# Patient Record
Sex: Female | Born: 1980 | Race: White | Hispanic: No | Marital: Married | Smoking: Current every day smoker
Health system: Southern US, Community
[De-identification: ages and names within clinical notes are randomized; demographics above are authoritative.]

## PROBLEM LIST (undated history)

## (undated) DIAGNOSIS — L209 Atopic dermatitis, unspecified: Secondary | ICD-10-CM

## (undated) DIAGNOSIS — N200 Calculus of kidney: Secondary | ICD-10-CM

## (undated) DIAGNOSIS — R011 Cardiac murmur, unspecified: Secondary | ICD-10-CM

## (undated) HISTORY — PX: CYSTOSCOPY WITH STENT PLACEMENT: SHX5790

## (undated) HISTORY — DX: Calculus of kidney: N20.0

## (undated) HISTORY — DX: Atopic dermatitis, unspecified: L20.9

## (undated) HISTORY — PX: LITHOTRIPSY: SUR834

---

## 2011-02-13 ENCOUNTER — Encounter: Payer: Self-pay | Admitting: Nurse Practitioner

## 2011-02-13 DIAGNOSIS — L209 Atopic dermatitis, unspecified: Secondary | ICD-10-CM | POA: Insufficient documentation

## 2013-03-28 ENCOUNTER — Ambulatory Visit (INDEPENDENT_AMBULATORY_CARE_PROVIDER_SITE_OTHER): Payer: BC Managed Care – PPO | Admitting: General Practice

## 2013-03-28 ENCOUNTER — Encounter: Payer: Self-pay | Admitting: General Practice

## 2013-03-28 VITALS — BP 138/86 | HR 90 | Temp 98.5°F | Ht 59.0 in | Wt 144.0 lb

## 2013-03-28 DIAGNOSIS — Z0000 Encounter for general adult medical examination without abnormal findings: Secondary | ICD-10-CM

## 2013-03-28 DIAGNOSIS — Z124 Encounter for screening for malignant neoplasm of cervix: Secondary | ICD-10-CM

## 2013-03-28 DIAGNOSIS — Z309 Encounter for contraceptive management, unspecified: Secondary | ICD-10-CM

## 2013-03-28 DIAGNOSIS — R51 Headache: Secondary | ICD-10-CM

## 2013-03-28 LAB — POCT UA - MICROSCOPIC ONLY: Crystals, Ur, HPF, POC: NEGATIVE

## 2013-03-28 LAB — POCT URINALYSIS DIPSTICK
Bilirubin, UA: NEGATIVE
Glucose, UA: NEGATIVE
Spec Grav, UA: 1.015

## 2013-03-28 MED ORDER — NORGESTIM-ETH ESTRAD TRIPHASIC 0.18/0.215/0.25 MG-25 MCG PO TABS: 1.0000 | ORAL_TABLET | ORAL | Status: DC

## 2013-03-28 MED ORDER — BUTALBITAL-ASA-CAFFEINE 50-325-40 MG PO CAPS: 1.0000 | ORAL_CAPSULE | Freq: Two times a day (BID) | ORAL | Status: AC | PRN

## 2013-03-28 NOTE — Progress Notes (Signed)
  Subjective:    Patient ID: Elizabeth Krause, female    DOB: Aug 07, 1981, 32 y.o.   MRN: [id number]  Gynecologic Exam The patient's pertinent negatives include no genital itching, genital lesions, genital odor, missed menses, pelvic pain, vaginal bleeding or vaginal discharge. Associated symptoms include headaches. Pertinent negatives include no abdominal pain, chills, diarrhea, dysuria, fever, flank pain, nausea, painful intercourse or rash. She uses oral contraceptives for contraception. Her menstrual history has been regular. There is no history of a Cesarean section, a gynecological surgery, miscarriage or an STD.  Reports throbbing headache that occurs about every other day. Reports decreased use of caffeine (3-4 cups weekly), and denies chocolate. OTC goody powders 2-3 packets (10 packs weekly). Aleve, tylenol, and other OTC medications not effective. Reports family history of migraines.    Review of Systems  Constitutional: Negative for fever and chills.  Respiratory: Negative for chest tightness.   Cardiovascular: Negative for chest pain and palpitations.  Gastrointestinal: Negative for nausea, abdominal pain, diarrhea and abdominal distention.  Genitourinary: Negative for dysuria, flank pain, vaginal discharge, pelvic pain and missed menses.  Skin: Negative.  Negative for rash.  Neurological: Positive for headaches. Negative for dizziness.  Psychiatric/Behavioral: Negative.        Objective:   Physical Exam  Constitutional: She is oriented to person, place, and time. She appears well-developed and well-nourished.  Cardiovascular: Normal rate, regular rhythm, normal heart sounds and intact distal pulses.   No murmur heard. Pulmonary/Chest: Effort normal and breath sounds normal. No respiratory distress. She exhibits no tenderness.  Abdominal: Soft. Bowel sounds are normal. She exhibits no distension. There is no tenderness.  Genitourinary: Vagina normal and uterus normal. No  breast swelling, tenderness, discharge or bleeding. Pelvic exam was performed with patient prone. There is no rash, tenderness, lesion or injury on the right labia. There is no rash, tenderness, lesion or injury on the left labia. Cervix exhibits no motion tenderness and no discharge. Right adnexum displays no mass and no tenderness. Left adnexum displays no mass and no tenderness.  Neurological: She is alert and oriented to person, place, and time.  Skin: Skin is warm and dry.          Assessment & Plan:  Continue taking current medications Take prescribed medications as order Discussed self breast exams Discussed healthy diet and exercise Discussed stress reduction and relaxation techniques Patient verbalized understanding Raymon Mutton, FNP-C

## 2013-03-28 NOTE — Patient Instructions (Addendum)

## 2013-03-29 LAB — PAP IG, CT-NG, RFX HPV ASCU
Chlamydia Probe Amp: NEGATIVE
GC Probe Amp: NEGATIVE

## 2013-04-03 ENCOUNTER — Telehealth: Payer: Self-pay | Admitting: General Practice

## 2013-04-03 NOTE — Telephone Encounter (Signed)
Mae to address 

## 2013-04-04 ENCOUNTER — Other Ambulatory Visit: Payer: Self-pay | Admitting: General Practice

## 2013-04-04 DIAGNOSIS — IMO0001 Reserved for inherently not codable concepts without codable children: Secondary | ICD-10-CM

## 2013-04-04 MED ORDER — NORGESTIM-ETH ESTRAD TRIPHASIC 0.18/0.215/0.25 MG-35 MCG PO TABS: 1.0000 | ORAL_TABLET | Freq: Every day | ORAL | Status: AC

## 2013-04-05 NOTE — Telephone Encounter (Signed)
Tri-sprintec prescription sent to pharmacy. thx

## 2013-04-06 NOTE — Telephone Encounter (Signed)
Pt aware [facility] is [facility]

## 2013-04-28 ENCOUNTER — Telehealth: Payer: Self-pay | Admitting: General Practice

## 2013-05-01 NOTE — Telephone Encounter (Signed)
CALLED PT AND SHE SAID SHE WAS OKAY WITH HER MEDICINE AND DIDN'T NEED A REFILL.

## 2013-06-08 ENCOUNTER — Encounter: Payer: Self-pay | Admitting: Nurse Practitioner

## 2013-06-08 ENCOUNTER — Ambulatory Visit (INDEPENDENT_AMBULATORY_CARE_PROVIDER_SITE_OTHER): Payer: BC Managed Care – PPO

## 2013-06-08 ENCOUNTER — Ambulatory Visit (INDEPENDENT_AMBULATORY_CARE_PROVIDER_SITE_OTHER): Payer: BC Managed Care – PPO | Admitting: Nurse Practitioner

## 2013-06-08 ENCOUNTER — Telehealth: Payer: Self-pay | Admitting: General Practice

## 2013-06-08 ENCOUNTER — Telehealth: Payer: Self-pay | Admitting: Nurse Practitioner

## 2013-06-08 VITALS — BP 155/102 | HR 85 | Temp 98.5°F | Ht 59.0 in | Wt 145.0 lb

## 2013-06-08 DIAGNOSIS — N390 Urinary tract infection, site not specified: Secondary | ICD-10-CM

## 2013-06-08 DIAGNOSIS — R319 Hematuria, unspecified: Secondary | ICD-10-CM

## 2013-06-08 LAB — POCT UA - MICROSCOPIC ONLY: Bacteria, U Microscopic: NEGATIVE

## 2013-06-08 LAB — POCT URINALYSIS DIPSTICK
Glucose, UA: NEGATIVE
Ketones, UA: NEGATIVE
Spec Grav, UA: <=1.005
Urobilinogen, UA: NEGATIVE

## 2013-06-08 MED ORDER — CIPROFLOXACIN HCL 500 MG PO TABS: 500.0000 mg | ORAL_TABLET | Freq: Two times a day (BID) | ORAL | Status: AC

## 2013-06-08 MED ORDER — HYDROCODONE-ACETAMINOPHEN 5-325 MG PO TABS: 1.0000 | ORAL_TABLET | Freq: Four times a day (QID) | ORAL | Status: AC | PRN

## 2013-06-08 NOTE — Patient Instructions (Addendum)
Urinary Tract Infection  Urinary tract infections (UTIs) can develop anywhere along your urinary tract. Your urinary tract is your body's drainage system for removing wastes and extra water. Your urinary tract includes two kidneys, two ureters, a bladder, and a urethra. Your kidneys are a pair of bean-shaped organs. Each kidney is about the size of your fist. They are located below your ribs, one on each side of your spine.  CAUSES  Infections are caused by microbes, which are microscopic organisms, including fungi, viruses, and bacteria. These organisms are so small that they can only be seen through a microscope. Bacteria are the microbes that most commonly cause UTIs.  SYMPTOMS   Symptoms of UTIs may vary by age and gender of the patient and by the location of the infection. Symptoms in young women typically include a frequent and intense urge to urinate and a painful, burning feeling in the bladder or urethra during urination. Older women and men are more likely to be tired, shaky, and weak and have muscle aches and abdominal pain. A fever may mean the infection is in your kidneys. Other symptoms of a kidney infection include pain in your back or sides below the ribs, nausea, and vomiting.  DIAGNOSIS  To diagnose a UTI, your caregiver will ask you about your symptoms. Your caregiver also will ask to provide a urine sample. The urine sample will be tested for bacteria and white blood cells. White blood cells are made by your body to help fight infection.  TREATMENT   Typically, UTIs can be treated with medication. Because most UTIs are caused by a bacterial infection, they usually can be treated with the use of antibiotics. The choice of antibiotic and length of treatment depend on your symptoms and the type of bacteria causing your infection.  HOME CARE INSTRUCTIONS   If you were prescribed antibiotics, take them exactly as your caregiver instructs you. Finish the medication even if you feel better after you  have only taken some of the medication.   Drink enough water and fluids to keep your urine clear or pale yellow.   Avoid caffeine, tea, and carbonated beverages. They tend to irritate your bladder.   Empty your bladder often. Avoid holding urine for long periods of time.   Empty your bladder before and after sexual intercourse.   After a bowel movement, women should cleanse from front to back. Use each tissue only once.  SEEK MEDICAL CARE IF:    You have back pain.   You develop a fever.   Your symptoms do not begin to resolve within 3 days.  SEEK IMMEDIATE MEDICAL CARE IF:    You have severe back pain or lower abdominal pain.   You develop chills.   You have nausea or vomiting.   You have continued burning or discomfort with urination.  MAKE SURE YOU:    Understand these instructions.   Will watch your condition.   Will get help right away if you are not doing well or get worse.  Document Released: 08/19/2005 Document Revised: 05/10/2012 Document Reviewed: 12/18/2011  Exit[employer] Patient Information 2014 [employer].

## 2013-06-08 NOTE — Progress Notes (Signed)
Subjective:    Patient ID: Elizabeth Krause, female    DOB: July 11, 1981, 32 y.o.   MRN: [id number]  Abdominal Pain This is a new problem. The current episode started yesterday. The onset quality is gradual. The problem occurs 2 to 4 times per day. The problem has been unchanged. The pain is located in the RLQ, LUQ and suprapubic region. The pain is at a severity of 7/10. The pain is moderate. The quality of the pain is aching and sharp. Associated symptoms include frequency. Associated symptoms comments: urgency . The pain is aggravated by coughing, certain positions and movement. The pain is relieved by being still. She has tried nothing for the symptoms. There is no history of GERD.      Review of Systems  Gastrointestinal: Positive for abdominal pain.  Genitourinary: Positive for urgency, frequency and flank pain.  All other systems reviewed and are negative.       Objective:   Physical Exam  Constitutional: She is oriented to person, place, and time. She appears well-developed and well-nourished.  Cardiovascular: Normal rate, regular rhythm, normal heart sounds and intact distal pulses.   Pulmonary/Chest: Effort normal and breath sounds normal.  Abdominal: There is tenderness (LLQ). There is guarding.  Musculoskeletal: Normal range of motion.  Neurological: She is alert and oriented to person, place, and time. She has normal reflexes.  Skin: Skin is warm and dry.  Psychiatric: She has a normal mood and affect. Her behavior is normal. Judgment and thought content normal.      BP 155/102  Pulse 85  Temp(Src) 98.5 F (36.9 C) (Oral)  Ht 4\' 11"  (1.499 m)  Wt 145 lb (65.772 kg)  BMI 29.27 kg/m2  LMP 05/25/2013  Results for orders placed in visit on 06/08/13  POCT UA - MICROSCOPIC ONLY      Result Value Range   WBC, Ur, HPF, POC 15-20     RBC, urine, microscopic 5-10     Bacteria, U Microscopic neg     Mucus, UA neg     Epithelial cells, urine per micros occ     Crystals, Ur, HPF, POC neg     Casts, Ur, LPF, POC neg     Yeast, UA neg    POCT URINALYSIS DIPSTICK      Result Value Range   Color, UA yellow     Clarity, UA cloudy     Glucose, UA neg     Bilirubin, UA neg     Ketones, UA neg     Spec Grav, UA <=1.005     Blood, UA trace     pH, UA 7.0     Protein, UA neg     Urobilinogen, UA negative     Nitrite, UA neg     Leukocytes, UA moderate (2+)    KUB- WNL-Preliminary reading by Paulene Floor, FNP  [facility]       Assessment & Plan:   1. UTI (urinary tract infection)   2. Hematuria    Orders Placed This Encounter  Procedures  . DG Abd 1 View    Standing Status: Future     Number of Occurrences: 1     Standing Expiration Date: 08/08/2014    Order Specific Question:  Reason for Exam (SYMPTOM  OR DIAGNOSIS REQUIRED)    Answer:  hematuria    Order Specific Question:  Is the patient pregnant?    Answer:  No    Order Specific Question:  Preferred imaging location?  Answer:  Internal  . POCT UA - Microscopic Only  . POCT urinalysis dipstick   Meds ordered this encounter  Medications  . ciprofloxacin (CIPRO) 500 MG tablet    Sig: Take 1 tablet (500 mg total) by mouth 2 (two) times daily.    Dispense:  14 tablet    Refill:  0    Order Specific Question:  Supervising Provider    Answer:  Deborra Medina   Force fluids AZO otc X 2 days RTO prn  Mary-Margaret Daphine Deutscher, FNP

## 2013-06-08 NOTE — Telephone Encounter (Signed)
appt today with mmm 

## 2013-06-08 NOTE — Telephone Encounter (Signed)
Pt called - question about meds - she is in a lot of pain and nausea now- can she have anything called to [facility] for the pain? Please call soon

## 2018-07-29 ENCOUNTER — Encounter (HOSPITAL_COMMUNITY): Payer: Self-pay | Admitting: Emergency Medicine

## 2018-07-29 ENCOUNTER — Emergency Department (HOSPITAL_COMMUNITY): Payer: BLUE CROSS/BLUE SHIELD

## 2018-07-29 ENCOUNTER — Other Ambulatory Visit: Payer: Self-pay

## 2018-07-29 ENCOUNTER — Emergency Department (HOSPITAL_COMMUNITY)
Admission: EM | Admit: 2018-07-29 | Discharge: 2018-07-29 | Disposition: A | Discharge status: Home / Self Care | Payer: BLUE CROSS/BLUE SHIELD | Attending: Emergency Medicine | Admitting: Emergency Medicine

## 2018-07-29 DIAGNOSIS — M79622 Pain in left upper arm: Secondary | ICD-10-CM | POA: Insufficient documentation

## 2018-07-29 DIAGNOSIS — F17200 Nicotine dependence, unspecified, uncomplicated: Secondary | ICD-10-CM | POA: Insufficient documentation

## 2018-07-29 DIAGNOSIS — Z79899 Other long term (current) drug therapy: Secondary | ICD-10-CM | POA: Insufficient documentation

## 2018-07-29 DIAGNOSIS — M79602 Pain in left arm: Secondary | ICD-10-CM | POA: Diagnosis not present

## 2018-07-29 DIAGNOSIS — R079 Chest pain, unspecified: Secondary | ICD-10-CM | POA: Diagnosis not present

## 2018-07-29 HISTORY — DX: Cardiac murmur, unspecified: R01.1

## 2018-07-29 LAB — I-STAT BETA HCG BLOOD, ED (MC, WL, AP ONLY): I-stat hCG, quantitative: 16.4 m[IU]/mL — ABNORMAL HIGH (ref <5)

## 2018-07-29 LAB — CBC
HCT: 41.1 % (ref 36.0–46.0)
Hemoglobin: 14.3 g/dL (ref 12.0–15.0)
MCH: 31.5 pg (ref 26.0–34.0)
MCHC: 34.8 g/dL (ref 30.0–36.0)
MCV: 90.5 fL (ref 78.0–100.0)
Platelets: 362 10*3/uL (ref 150–400)
RBC: 4.54 MIL/uL (ref 3.87–5.11)
RDW: 13.5 % (ref 11.5–15.5)
WBC: 9.2 10*3/uL (ref 4.0–10.5)

## 2018-07-29 LAB — BASIC METABOLIC PANEL
Anion gap: 10 (ref 5–15)
BUN: 9 mg/dL (ref 6–20)
CO2: 20 mmol/L — ABNORMAL LOW (ref 22–32)
Calcium: 9 mg/dL (ref 8.9–10.3)
Chloride: 109 mmol/L (ref 98–111)
Creatinine, Ser: 0.74 mg/dL (ref 0.44–1.00)
GFR calc Af Amer: >60 mL/min (ref >60)
GFR calc non Af Amer: >60 mL/min (ref >60)
Glucose, Bld: 93 mg/dL (ref 70–99)
Potassium: 3.6 mmol/L (ref 3.5–5.1)
Sodium: 139 mmol/L (ref 135–145)

## 2018-07-29 LAB — TROPONIN I: Troponin I: <0.03 ng/mL (ref <0.03)

## 2018-07-29 MED ORDER — MELOXICAM 7.5 MG PO TABS: 15.0000 mg | ORAL_TABLET | Freq: Every day | ORAL | 0 refills | Status: AC | PRN

## 2018-07-29 NOTE — ED Triage Notes (Signed)
Pt reports L sided CP intermittently and constant L arm pain X 3 days. Goody powder at home with no relief. Denies recent heavy lifting or injury.

## 2018-07-29 NOTE — ED Provider Notes (Signed)
[facility] EMERGENCY DEPARTMENT Provider Note   CSN: [id number] Arrival date & time: 07/29/18  1028     History   Chief Complaint Chief Complaint  Patient presents with  . Chest Pain    HPI Elizabeth Krause is a 37 y.o. female.  HPI   37 year old female with left lower extremity pain.  Gradual onset about 3 days ago.  She denies any change in activity, specific trauma or strain.  Pain is primarily in the upper arm but has symptoms down to her hand and also her left shoulder and left upper chest.  Constant ache.  Feels somewhat worse when she squeezes her arm tightly or flexes tightly at the elbow.  No rash.  No swelling.  No numbness or tingling.  She has been taking Goody powder with minimal improvement.  No respiratory complaints.  Past Medical History:  Diagnosis Date  . Atopic dermatitis   . Heart murmur   . Kidney stones     Patient Active Problem List   Diagnosis Date Noted  . Atopic dermatitis     Past Surgical History:  Procedure Laterality Date  . CESAREAN SECTION    . CYSTOSCOPY WITH STENT PLACEMENT    . LITHOTRIPSY       OB History   None      Home Medications    Prior to Admission medications   Medication Sig Start Date End Date Taking? Authorizing Provider  butalbital-aspirin-caffeine [facility]) 50-325-40 MG per capsule Take 1 capsule by mouth 2 (two) times daily as needed for headache. 03/28/13   Coralie Keens, FNP  ciprofloxacin (CIPRO) 500 MG tablet Take 1 tablet (500 mg total) by mouth 2 (two) times daily. 06/08/13   Daphine Deutscher Mary-Margaret, FNP  HYDROcodone-acetaminophen (LORTAB) 5-325 MG per tablet Take 1 tablet by mouth every 6 (six) hours as needed for pain. 06/08/13   Daphine Deutscher Mary-Margaret, FNP  hydrOXYzine (ATARAX) 25 MG tablet Take 25 mg by mouth 3 (three) times daily as needed.      [provider]  multivitamin [facility]) per tablet Take 1 tablet by mouth daily.      [provider]  Norgestimate-Ethinyl Estradiol  Triphasic (TRI-SPRINTEC) 0.18/0.215/0.25 MG-35 MCG tablet Take 1 tablet by mouth daily. 04/04/13   Coralie Keens, FNP    Family History Family History  Problem Relation Age of Onset  . Alcohol abuse Mother   . Cancer Maternal Grandmother     Social History Social History   Tobacco Use  . Smoking status: Current Every Day Smoker    Packs/day: 0.50  . Smokeless tobacco: Never Used  Substance Use Topics  . Alcohol use: Yes    Comment: weekends  . Drug use: No     Allergies   Patient has no known allergies.   Review of Systems Review of Systems  All systems reviewed and negative, other than as noted in HPI.  Physical Exam Updated Vital Signs BP 131/81   Pulse 81   Temp 98.1 F (36.7 C) (Oral)   Resp 17   Ht 4\' 10"  (1.473 m)   Wt 65.8 kg   LMP 07/28/2018   SpO2 95%   BMI 30.31 kg/m   Physical Exam  Constitutional: She appears well-developed and well-nourished. No distress.  HENT:  Head: Normocephalic and atraumatic.  Eyes: Conjunctivae are normal. Right eye exhibits no discharge. Left eye exhibits no discharge.  Neck: Neck supple.  Cardiovascular: Normal rate, regular rhythm and normal heart sounds. Exam reveals no gallop and no  friction rub.  No murmur heard. Pulmonary/Chest: Effort normal and breath sounds normal. No respiratory distress.  Abdominal: Soft. She exhibits no distension. There is no tenderness.  Musculoskeletal: She exhibits no edema or tenderness.  Left upper extremity is grossly normal to inspection and symmetric as compared to the right.  There is no rash.  No appreciable swelling.  Easily palpable radial pulse.  She is able to actively range at the shoulder elbow and wrist without any apparent discomfort.  Sensation is intact to light touch.  Radial, median and ulnar nerve motor function is intact.  She has no neck tenderness.  Denies increase in symptoms with neck flexion or rotation.  Neurological: She is alert.  Skin: Skin is warm and  dry.  Psychiatric: She has a normal mood and affect. Her behavior is normal. Thought content normal.  Nursing note and vitals reviewed.    ED Treatments / Results  Labs (all labs ordered are listed, but only abnormal results are displayed) Labs Reviewed  BASIC METABOLIC PANEL - Abnormal; Notable for the following components:      Result Value   CO2 20 (*)    All other components within normal limits  I-STAT BETA HCG BLOOD, ED ([facility], [facility], [facility] ONLY) - Abnormal; Notable for the following components:   I-stat hCG, quantitative 16.4 (*)    All other components within normal limits  CBC  TROPONIN I    EKG None  Radiology Dg Chest 2 View  Result Date: 07/29/2018 CLINICAL DATA:  Acute chest pain for 3 days. EXAM: CHEST - 2 VIEW COMPARISON:  None. FINDINGS: The cardiomediastinal silhouette is unremarkable. There is no evidence of focal airspace disease, pulmonary edema, suspicious pulmonary nodule/mass, pleural effusion, or pneumothorax. No acute bony abnormalities are identified. IMPRESSION: No active cardiopulmonary disease. Electronically Signed   By: Harmon Pier M.D.   On: 07/29/2018 11:33    Procedures Procedures (including critical care time)  Medications Ordered in ED Medications - No data to display   Initial Impression / Assessment and Plan / ED Course  I have reviewed the triage vital signs and the nursing notes.  Pertinent labs & imaging results that were available during my care of the patient were reviewed by me and considered in my medical decision making (see chart for details).     37 year old female with left upper extremity pain.  Pain is primarily in the upper arm but does have some shoulder/chest pain.  She reports symptoms already on her hand.  Consider cervical radiculopathy but symptoms are not completely consistent with this.  She describes a constant ache.  It is not any worse with movement of the extremity or the neck.  I cannot reproduce on my exam.  She is  got great pulses.  She is neurologically intact.  I am not appreciating rash or any significant skin changes.  Seems very atypical for ACS given the constant duration for several days.  Troponin is normal.  Chest x-ray without acute abnormality.  I am not sure as to exact etiology, but have a low suspicion for emergent process at this point time.  Return precautions were discussed.  Symptom medic treatment otherwise.  I have reviewed the triage vital signs and the nursing notes. Prior records were reviewed for additional information.    Pertinent labs & imaging results that were available during my care of the patient were reviewed by me and considered in my medical decision making (see chart for details).   Final Clinical Impressions(s) /  ED Diagnoses   Final diagnoses:  Left arm pain    ED Discharge Orders    None       Raeford Razor, MD 08/01/18 [phone]

## 2019-05-04 DIAGNOSIS — Z6830 Body mass index (BMI) 30.0-30.9, adult: Secondary | ICD-10-CM | POA: Diagnosis not present

## 2019-05-04 DIAGNOSIS — R030 Elevated blood-pressure reading, without diagnosis of hypertension: Secondary | ICD-10-CM | POA: Diagnosis not present

## 2019-09-15 DIAGNOSIS — K279 Peptic ulcer, site unspecified, unspecified as acute or chronic, without hemorrhage or perforation: Secondary | ICD-10-CM | POA: Diagnosis not present

## 2019-09-15 DIAGNOSIS — Z6830 Body mass index (BMI) 30.0-30.9, adult: Secondary | ICD-10-CM | POA: Diagnosis not present

## 2019-09-15 DIAGNOSIS — R1013 Epigastric pain: Secondary | ICD-10-CM | POA: Diagnosis not present

## 2020-04-19 IMAGING — DX DG CHEST 2V
2 series · 2 of 2 positions shown · non-contrast
Comparison: None.

CLINICAL DATA: Acute chest pain for 3 days.

EXAM:
CHEST - 2 VIEW

[chest pa]
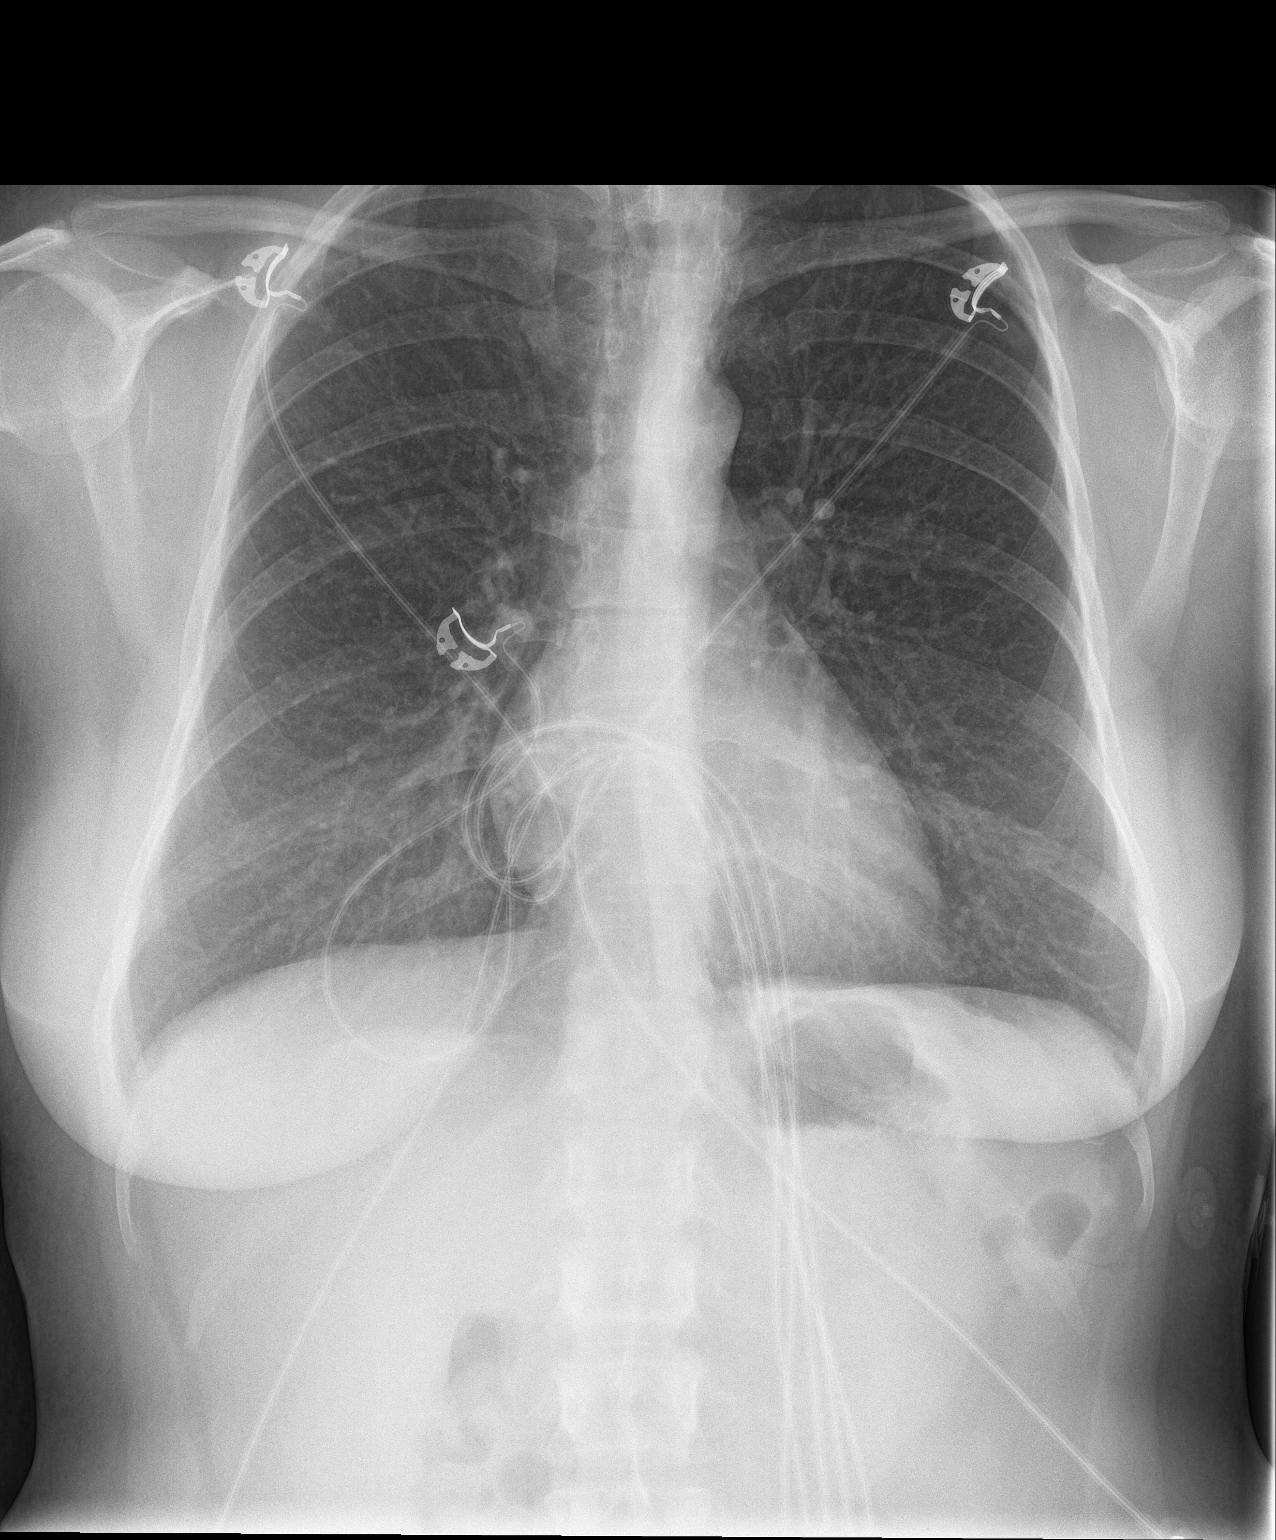

[chest lat]
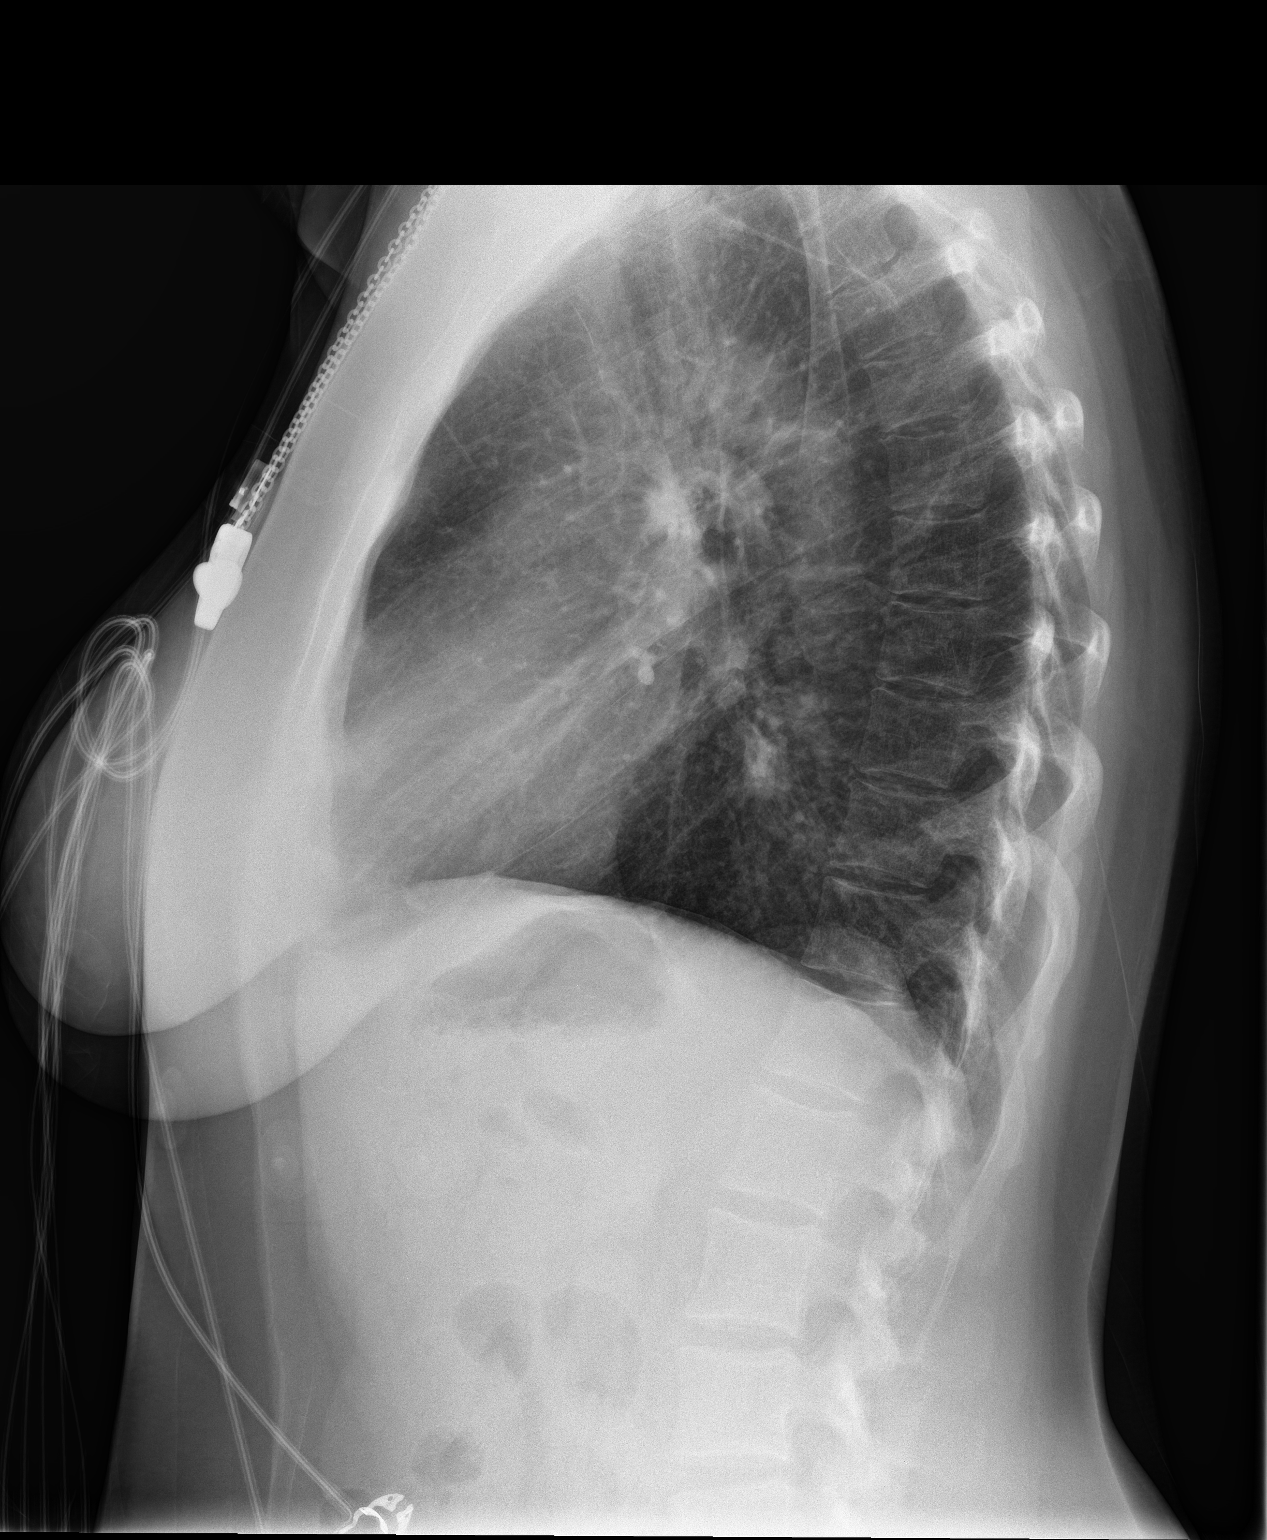

[2 of 2 positions shown; findings below may reference images not displayed]

FINDINGS: The cardiomediastinal silhouette is unremarkable.

There is no evidence of focal airspace disease, pulmonary edema,
suspicious pulmonary nodule/mass, pleural effusion, or pneumothorax.

No acute bony abnormalities are identified.
IMPRESSION: No active cardiopulmonary disease.

## 2022-12-08 DIAGNOSIS — M722 Plantar fascial fibromatosis: Secondary | ICD-10-CM | POA: Diagnosis not present

## 2022-12-08 DIAGNOSIS — R030 Elevated blood-pressure reading, without diagnosis of hypertension: Secondary | ICD-10-CM | POA: Diagnosis not present

## 2022-12-08 DIAGNOSIS — Z6830 Body mass index (BMI) 30.0-30.9, adult: Secondary | ICD-10-CM | POA: Diagnosis not present

## 2022-12-08 DIAGNOSIS — R69 Illness, unspecified: Secondary | ICD-10-CM | POA: Diagnosis not present

## 2023-04-08 DIAGNOSIS — Z6827 Body mass index (BMI) 27.0-27.9, adult: Secondary | ICD-10-CM | POA: Diagnosis not present

## 2023-04-08 DIAGNOSIS — R509 Fever, unspecified: Secondary | ICD-10-CM | POA: Diagnosis not present

## 2023-04-08 DIAGNOSIS — R030 Elevated blood-pressure reading, without diagnosis of hypertension: Secondary | ICD-10-CM | POA: Diagnosis not present

## 2023-04-08 DIAGNOSIS — F17210 Nicotine dependence, cigarettes, uncomplicated: Secondary | ICD-10-CM | POA: Diagnosis not present

## 2023-04-08 DIAGNOSIS — J029 Acute pharyngitis, unspecified: Secondary | ICD-10-CM | POA: Diagnosis not present

## 2023-05-13 DIAGNOSIS — J111 Influenza due to unidentified influenza virus with other respiratory manifestations: Secondary | ICD-10-CM | POA: Diagnosis not present

## 2023-05-13 DIAGNOSIS — Z6828 Body mass index (BMI) 28.0-28.9, adult: Secondary | ICD-10-CM | POA: Diagnosis not present

## 2023-05-13 DIAGNOSIS — Z20828 Contact with and (suspected) exposure to other viral communicable diseases: Secondary | ICD-10-CM | POA: Diagnosis not present

## 2023-05-13 DIAGNOSIS — F17210 Nicotine dependence, cigarettes, uncomplicated: Secondary | ICD-10-CM | POA: Diagnosis not present

## 2023-05-13 DIAGNOSIS — R030 Elevated blood-pressure reading, without diagnosis of hypertension: Secondary | ICD-10-CM | POA: Diagnosis not present

## 2024-03-02 ENCOUNTER — Other Ambulatory Visit: Payer: Self-pay | Admitting: Anesthesiology

## 2024-03-02 DIAGNOSIS — E782 Mixed hyperlipidemia: Secondary | ICD-10-CM | POA: Diagnosis not present

## 2024-03-02 DIAGNOSIS — R5383 Other fatigue: Secondary | ICD-10-CM | POA: Diagnosis not present

## 2024-03-02 DIAGNOSIS — Z6831 Body mass index (BMI) 31.0-31.9, adult: Secondary | ICD-10-CM | POA: Diagnosis not present

## 2024-03-02 DIAGNOSIS — Z1231 Encounter for screening mammogram for malignant neoplasm of breast: Secondary | ICD-10-CM

## 2024-03-02 DIAGNOSIS — Z720 Tobacco use: Secondary | ICD-10-CM | POA: Diagnosis not present

## 2024-03-02 DIAGNOSIS — D582 Other hemoglobinopathies: Secondary | ICD-10-CM | POA: Diagnosis not present

## 2024-03-24 ENCOUNTER — Ambulatory Visit
Admission: RE | Admit: 2024-03-24 | Discharge: 2024-03-24 | Disposition: A | Discharge status: Home / Self Care | Source: Ambulatory Visit | Attending: Anesthesiology | Admitting: Anesthesiology

## 2024-03-24 DIAGNOSIS — Z1231 Encounter for screening mammogram for malignant neoplasm of breast: Secondary | ICD-10-CM

## 2024-03-29 ENCOUNTER — Other Ambulatory Visit: Payer: Self-pay | Admitting: Anesthesiology

## 2024-03-29 DIAGNOSIS — R928 Other abnormal and inconclusive findings on diagnostic imaging of breast: Secondary | ICD-10-CM

## 2024-04-05 DIAGNOSIS — Z124 Encounter for screening for malignant neoplasm of cervix: Secondary | ICD-10-CM | POA: Diagnosis not present

## 2024-04-11 ENCOUNTER — Other Ambulatory Visit: Payer: Self-pay | Admitting: Family Medicine

## 2024-04-11 DIAGNOSIS — R928 Other abnormal and inconclusive findings on diagnostic imaging of breast: Secondary | ICD-10-CM

## 2024-04-12 ENCOUNTER — Ambulatory Visit
Admission: RE | Admit: 2024-04-12 | Discharge: 2024-04-12 | Disposition: A | Discharge status: Home / Self Care | Source: Ambulatory Visit | Attending: Anesthesiology | Admitting: Anesthesiology

## 2024-04-12 DIAGNOSIS — R928 Other abnormal and inconclusive findings on diagnostic imaging of breast: Secondary | ICD-10-CM

## 2024-04-12 DIAGNOSIS — N6311 Unspecified lump in the right breast, upper outer quadrant: Secondary | ICD-10-CM | POA: Diagnosis not present

## 2024-04-12 DIAGNOSIS — R922 Inconclusive mammogram: Secondary | ICD-10-CM | POA: Diagnosis not present
